# Patient Record
Sex: Female | Born: 1965 | Race: Black or African American | Hispanic: No | Marital: Married | Smoking: Former smoker
Health system: Southern US, Community
[De-identification: ages and names within clinical notes are randomized; demographics above are authoritative.]

## PROBLEM LIST (undated history)

## (undated) DIAGNOSIS — E119 Type 2 diabetes mellitus without complications: Secondary | ICD-10-CM

## (undated) HISTORY — DX: Type 2 diabetes mellitus without complications: E11.9

---

## 1999-09-17 ENCOUNTER — Emergency Department (HOSPITAL_COMMUNITY): Admission: EM | Admit: 1999-09-17 | Discharge: 1999-09-17 | Payer: Self-pay | Admitting: Emergency Medicine

## 2013-05-06 ENCOUNTER — Ambulatory Visit (HOSPITAL_COMMUNITY)
Admission: RE | Admit: 2013-05-06 | Discharge: 2013-05-06 | Disposition: A | Discharge status: Home / Self Care | Payer: 59 | Source: Ambulatory Visit | Attending: Family Medicine | Admitting: Family Medicine

## 2013-05-06 ENCOUNTER — Ambulatory Visit (HOSPITAL_COMMUNITY)
Admission: EM | Admit: 2013-05-06 | Discharge: 2013-05-06 | Disposition: A | Discharge status: Home / Self Care | Payer: 59 | Source: Ambulatory Visit | Attending: Family Medicine | Admitting: Family Medicine

## 2013-05-06 ENCOUNTER — Ambulatory Visit (INDEPENDENT_AMBULATORY_CARE_PROVIDER_SITE_OTHER): Payer: 59 | Admitting: Family Medicine

## 2013-05-06 DIAGNOSIS — M79609 Pain in unspecified limb: Secondary | ICD-10-CM

## 2013-05-06 DIAGNOSIS — R51 Headache: Secondary | ICD-10-CM | POA: Insufficient documentation

## 2013-05-06 DIAGNOSIS — S0990XA Unspecified injury of head, initial encounter: Secondary | ICD-10-CM

## 2013-05-06 DIAGNOSIS — S060X0A Concussion without loss of consciousness, initial encounter: Secondary | ICD-10-CM

## 2013-05-06 DIAGNOSIS — M545 Low back pain: Secondary | ICD-10-CM

## 2013-05-06 DIAGNOSIS — M79622 Pain in left upper arm: Secondary | ICD-10-CM

## 2013-05-06 DIAGNOSIS — S060X9A Concussion with loss of consciousness of unspecified duration, initial encounter: Secondary | ICD-10-CM | POA: Diagnosis not present

## 2013-05-06 DIAGNOSIS — S060XAA Concussion with loss of consciousness status unknown, initial encounter: Secondary | ICD-10-CM | POA: Insufficient documentation

## 2013-05-06 MED ORDER — CYCLOBENZAPRINE HCL 10 MG PO TABS: 10.0000 mg | ORAL_TABLET | Freq: Three times a day (TID) | ORAL | Status: AC | PRN

## 2013-05-06 MED ORDER — IBUPROFEN 600 MG PO TABS: 600.0000 mg | ORAL_TABLET | Freq: Four times a day (QID) | ORAL | Status: AC | PRN

## 2013-05-06 MED ORDER — HYDROCODONE-ACETAMINOPHEN 5-325 MG PO TABS: 1.0000 | ORAL_TABLET | Freq: Four times a day (QID) | ORAL | Status: AC | PRN

## 2013-05-06 NOTE — Patient Instructions (Addendum)
Go to [facility] and check in with the ER. Make sure to register as an outpatient with the CT department and not as an ER patient. Follow up with Dr. Clelia Croft within 48 hours of getting the CT skin.   Concussion, Adult A concussion, or closed-head injury, is a brain injury caused by a direct blow to the head or by a quick and sudden movement (jolt) of the head or neck. Concussions are usually not life-threatening. Even so, the effects of a concussion can be serious. If you have had a concussion before, you are more likely to experience concussion-like symptoms after a direct blow to the head.  CAUSES   Direct blow to the head, such as from running into another player during a soccer game, being hit in a fight, or hitting your head on a hard surface.  A jolt of the head or neck that causes the brain to move back and forth inside the skull, such as in a car crash. SIGNS AND SYMPTOMS  The signs of a concussion can be hard to notice. Early on, they may be missed by you, family members, and health care providers. You may look fine but act or feel differently. Symptoms are usually temporary, but they may last for days, weeks, or even longer. Some symptoms may appear right away while others may not show up for hours or days. Every head injury is different. Symptoms include:   Mild to moderate headaches that will not go away.  A feeling of pressure inside your head.  Having more trouble than usual:   Learning or remembering things you have heard.  Answering questions.  Paying attention or concentrating.   Organizing daily tasks.   Making decisions and solving problems.   Slowness in thinking, acting or reacting, speaking, or reading.   Getting lost or being easily confused.   Feeling tired all the time or lacking energy (fatigued).   Feeling drowsy.   Sleep disturbances.   Sleeping more than usual.   Sleeping less than usual.   Trouble falling asleep.   Trouble  sleeping (insomnia).   Loss of balance or feeling lightheaded or dizzy.   Nausea or vomiting.   Numbness or tingling.   Increased sensitivity to:   Sounds.   Lights.   Distractions.   Vision problems or eyes that tire easily.   Diminished sense of taste or smell.   Ringing in the ears.   Mood changes such as feeling sad or anxious.   Becoming easily irritated or angry for little or no reason.   Lack of motivation.  Seeing or hearing things other people do not see or hear (hallucinations). DIAGNOSIS  Your health care provider can usually diagnose a concussion based on a description of your injury and symptoms. He or she will ask whether you passed out (lost consciousness) and whether you are having trouble remembering events that happened right before and during your injury.  Your evaluation might include:   A brain scan to look for signs of injury to the brain. Even if the test shows no injury, you may still have a concussion.   Blood tests to be sure other problems are not present. TREATMENT   Concussions are usually treated in an emergency department, in urgent care, or at a clinic. You may need to stay in the hospital overnight for further treatment.   Tell your health care provider if you are taking any medicines, including prescription medicines, over-the-counter medicines, and natural remedies. Some medicines,  such as blood thinners (anticoagulants) and aspirin, may increase the chance of complications. Also tell your health care provider whether you have had alcohol or are taking illegal drugs. This information may affect treatment.  Your health care provider will send you home with important instructions to follow.  How fast you will recover from a concussion depends on many factors. These factors include how severe your concussion is, what part of your brain was injured, your age, and how healthy you were before the concussion.  Most people with  mild injuries recover fully. Recovery can take time. In general, recovery is slower in older persons. Also, persons who have had a concussion in the past or have other medical problems may find that it takes longer to recover from their current injury. HOME CARE INSTRUCTIONS  General Instructions  Carefully follow the directions your health care provider gave you.  Only take over-the-counter or prescription medicines for pain, discomfort, or fever as directed by your health care provider.  Take only those medicines that your health care provider has approved.  Do not drink alcohol until your health care provider says you are well enough to do so. Alcohol and certain other drugs may slow your recovery and can put you at risk of further injury.  If it is harder than usual to remember things, write them down.  If you are easily distracted, try to do one thing at a time. For example, do not try to watch TV while fixing dinner.  Talk with family members or close friends when making important decisions.  Keep all follow-up appointments. Repeated evaluation of your symptoms is recommended for your recovery.  Watch your symptoms and tell others to do the same. Complications sometimes occur after a concussion. Older adults with a brain injury may have a higher risk of serious complications such as of a blood clot on the brain.  Tell your teachers, school nurse, school counselor, coach, athletic trainer, or work Production designer, theatre/television/film about your injury, symptoms, and restrictions. Tell them about what you can or cannot do. They should watch for:   Increased problems with attention or concentration.   Increased difficulty remembering or learning new information.   Increased time needed to complete tasks or assignments.   Increased irritability or decreased ability to cope with stress.   Increased symptoms.   Rest. Rest helps the brain to heal. Make sure you:  Get plenty of sleep at night. Avoid  staying up late at night.  Keep the same bedtime hours on weekends and weekdays.  Rest during the day. Take daytime naps or rest breaks when you feel tired.  Limit activities that require a lot of thought or concentration. These includes   Doing homework or job-related work.   Watching TV.   Working on the computer.  Avoid any situation where there is potential for another head injury (football, hockey, soccer, basketball, martial arts, downhill snow sports and horseback riding). Your condition will get worse every time you experience a concussion. You should avoid these activities until you are evaluated by the appropriate follow-up caregivers. Returning To Your Regular Activities You will need to return to your normal activities slowly, not all at once. You must give your body and brain enough time for recovery.  Do not return to sports or other athletic activities until your health care provider tells you it is safe to do so.  Ask your health care provider when you can drive, ride a bicycle, or operate heavy machinery. Your ability  to react may be slower after a brain injury. Never do these activities if you are dizzy.  Ask your health care provider about when you can return to work or school. Preventing Another Concussion It is very important to avoid another brain injury, especially before you have recovered. In rare cases, another injury can lead to permanent brain damage, brain swelling, or death. The risk of this is greatest during the first 7 10 days after a head injury. Avoid injuries by:   Wearing a seat belt when riding in a car.   Drinking alcohol only in moderation.   Wearing a helmet when biking, skiing, skateboarding, skating, or doing similar activities.  Avoiding activities that could lead to a second concussion, such as contact or recreational sports, until your health care provider says it is OK.  Taking safety measures in your home.   Remove clutter and  tripping hazards from floors and stairways.   Use grab bars in bathrooms and handrails by stairs.   Place non-slip mats on floors and in bathtubs.   Improve lighting in dim areas. SEEK MEDICAL CARE IF:   You have increased problems paying attention or concentrating.   You have increased difficulty remembering or learning new information.   You need more time to complete tasks or assignments than before.   You have increased irritability or decreased ability to cope with stress.  You have more symptoms than before. Seek medical care if you have any of the following symptoms for more than 2 weeks after your injury:   Lasting (chronic) headaches.   Dizziness or balance problems.   Nausea.  Vision problems.   Increased sensitivity to noise or light.   Depression or mood swings.   Anxiety or irritability.   Memory problems.   Difficulty concentrating or paying attention.   Sleep problems.   Feeling tired all the time. SEEK IMMEDIATE MEDICAL CARE IF:   You have severe or worsening headaches. These may be a sign of a blood clot in the brain.  You have weakness (even if only in one hand, leg, or part of the face).  You have numbness.  You have decreased coordination.   You vomit repeatedly.  You have increased sleepiness.  One pupil is larger than the other.   You have convulsions.   You have slurred speech.   You have increased confusion. This may be a sign of a blood clot in the brain.  You have increased restlessness, agitation, or irritability.   You are unable to recognize people or places.   You have neck pain.   It is difficult to wake you up.   You have unusual behavior changes.   You lose consciousness. MAKE SURE YOU:   Understand these instructions.  Will watch your condition.  Will get help right away if you are not doing well or get worse. Document Released: 07/18/2003 Document Revised: 12/28/2012 Document  Reviewed: 11/17/2012 [facility] Patient Information 2014 [place], [place].
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
## 2014-01-19 DIAGNOSIS — Z0271 Encounter for disability determination: Secondary | ICD-10-CM

## 2014-02-01 ENCOUNTER — Telehealth: Payer: Self-pay | Admitting: Family Medicine

## 2014-02-01 NOTE — Telephone Encounter (Signed)
Itemized billing statement needed for 05/06/2013. Please send to disabilities department via interoffice mail.   Thank you so much!
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
